# Patient Record
Sex: Male | Born: 1995 | Hispanic: Yes | Marital: Single | Smoking: Never smoker
Health system: Southern US, Community
[De-identification: ages and names within clinical notes are randomized; demographics above are authoritative.]

---
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
## 2017-04-30 ENCOUNTER — Other Ambulatory Visit: Payer: Self-pay | Admitting: Physician Assistant

## 2017-04-30 ENCOUNTER — Ambulatory Visit
Admission: RE | Admit: 2017-04-30 | Discharge: 2017-04-30 | Disposition: A | Discharge status: Home / Self Care | Payer: 59 | Source: Ambulatory Visit | Attending: Physician Assistant | Admitting: Physician Assistant

## 2017-04-30 DIAGNOSIS — M25531 Pain in right wrist: Secondary | ICD-10-CM | POA: Insufficient documentation

## 2017-04-30 DIAGNOSIS — G5601 Carpal tunnel syndrome, right upper limb: Secondary | ICD-10-CM

## 2018-04-06 ENCOUNTER — Ambulatory Visit (INDEPENDENT_AMBULATORY_CARE_PROVIDER_SITE_OTHER): Payer: Worker's Compensation

## 2018-04-06 ENCOUNTER — Encounter: Payer: Self-pay | Admitting: Emergency Medicine

## 2018-04-06 ENCOUNTER — Other Ambulatory Visit: Payer: Self-pay

## 2018-04-06 ENCOUNTER — Ambulatory Visit
Admission: EM | Admit: 2018-04-06 | Discharge: 2018-04-06 | Disposition: A | Discharge status: Home / Self Care | Payer: Worker's Compensation | Attending: Family Medicine | Admitting: Family Medicine

## 2018-04-06 DIAGNOSIS — W319XXA Contact with unspecified machinery, initial encounter: Secondary | ICD-10-CM

## 2018-04-06 DIAGNOSIS — S61210A Laceration without foreign body of right index finger without damage to nail, initial encounter: Secondary | ICD-10-CM | POA: Diagnosis not present

## 2018-04-06 DIAGNOSIS — W208XXD Other cause of strike by thrown, projected or falling object, subsequent encounter: Secondary | ICD-10-CM

## 2018-04-06 DIAGNOSIS — Z23 Encounter for immunization: Secondary | ICD-10-CM | POA: Diagnosis not present

## 2018-04-06 DIAGNOSIS — S61220A Laceration with foreign body of right index finger without damage to nail, initial encounter: Secondary | ICD-10-CM

## 2018-04-06 DIAGNOSIS — W268XXD Contact with other sharp object(s), not elsewhere classified, subsequent encounter: Secondary | ICD-10-CM | POA: Diagnosis not present

## 2018-04-06 MED ORDER — MUPIROCIN 2 % EX OINT: TOPICAL_OINTMENT | CUTANEOUS | 0 refills | Status: AC

## 2018-04-06 MED ORDER — CEPHALEXIN 500 MG PO CAPS: 500.0000 mg | ORAL_CAPSULE | Freq: Four times a day (QID) | ORAL | 0 refills | Status: AC

## 2018-04-06 MED ORDER — BUPIVACAINE HCL (PF) 0.25 % IJ SOLN: 5.0000 mL | Freq: Once | INTRAMUSCULAR | Status: DC

## 2018-04-06 MED ORDER — TETANUS-DIPHTH-ACELL PERTUSSIS 5-2.5-18.5 LF-MCG/0.5 IM SUSP
0.5000 mL | Freq: Once | INTRAMUSCULAR | Status: AC
  Administered 2018-04-06: 0.5 mL via INTRAMUSCULAR

## 2018-04-06 MED ORDER — LIDOCAINE HCL (PF) 1 % IJ SOLN: 5.0000 mL | Freq: Once | INTRAMUSCULAR | Status: DC

## 2018-04-06 NOTE — Discharge Instructions (Signed)
Take medication as prescribed. Keep clean and dry. Wound check with occupational health in 3 days. Suture removal in 10 days. See above to call to schedule.   Follow up with your primary care physician this week as needed. Return to Urgent care for new or worsening concerns.

## 2018-04-06 NOTE — ED Triage Notes (Signed)
Patient c/o laceration to his right index finger today while at work. States he cut it on a machine at work.

## 2018-04-06 NOTE — ED Provider Notes (Signed)
[facility] ____________________________________________  Time seen: Approximately 4:32 PM  I have reviewed the triage vital signs and the nursing notes.   HISTORY  Chief Complaint Extremity Laceration   HPI Gregory Vincent is a 22 y.o. male presenting for evaluation of right index finger laceration that occurred just prior to arrival while at work.  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  States while at work he had turned around and heard a piece of machinery started to fall, and had a reflex he went to catch it, but states the machine went against his finger cutting it.  Denies crush injury.  Applied pressure but no other alleviating measures attempted.  States the machine overall was fairly clean.  Unsure of last tetanus immunization.  States moderate pain to finger currently.  Denies any loss of sensation or pain radiation.  Denies any other pain or injuries.  No fall, head injury or loss of consciousness.  Reports left hand dominant.  Denies other aggravating alleviating factors.    History reviewed. No pertinent past medical history. Denies There are no active problems to display for this patient.   History reviewed. No pertinent surgical history.    Current Facility-Administered Medications:  .  bupivacaine (PF) (MARCAINE) 0.25 % injection 5 mL, 5 mL, Other, Once, Renford Dills, NP .  lidocaine (PF) (XYLOCAINE) 1 % injection 5 mL, 5 mL, Other, Once, Renford Dills, NP  Current Outpatient Medications:  .  cephALEXin (KEFLEX) 500 MG capsule, Take 1 capsule (500 mg total) by mouth 4 (four) times daily for 7 days., Disp: 28 capsule, Rfl: 0 .  mupirocin ointment (BACTROBAN) 2 %, Apply two times a day for 10 days., Disp: 22 g, Rfl: 0  Allergies Patient has no known allergies.  History reviewed. No pertinent family history.  Social History Social History   Tobacco Use  . Smoking status: Never Smoker  . Smokeless tobacco: Never Used  Substance  Use Topics  . Alcohol use: Yes    Comment: socially   . Drug use: Never    Review of Systems Constitutional: No fever Cardiovascular: Denies chest pain. Respiratory: Denies shortness of breath. Gastrointestinal: No abdominal pain.   Musculoskeletal: Negative for back pain. Skin: As above ____________________________________________   PHYSICAL EXAM:  VITAL SIGNS: ED Triage Vitals  Enc Vitals Group     BP 04/06/18 1524 (!) 183/111     Pulse Rate 04/06/18 1524 72     Resp 04/06/18 1524 18     Temp 04/06/18 1524 98.1 F (36.7 C)     Temp Source 04/06/18 1524 Oral     SpO2 04/06/18 1524 97 %     Weight 04/06/18 1522 220 lb (99.8 kg)     Height 04/06/18 1522 5\' 1"  (1.549 m)     Head Circumference --      Peak Flow --      Pain Score 04/06/18 1522 5     Pain Loc --      Pain Edu? --      Excl. in GC? --    Vitals:   04/06/18 1522 04/06/18 1524  BP:  (!) 183/111  Pulse:  72  Resp:  18  Temp:  98.1 F (36.7 C)  TempSrc:  Oral  SpO2:  97%  Weight: 220 lb (99.8 kg)   Height: 5\' 1"  (1.549 m)      Constitutional: Alert and oriented. Well appearing and in no acute distress. ENT      Head: Normocephalic and  atraumatic. Cardiovascular: Normal rate, regular rhythm. Grossly normal heart sounds.  Good peripheral circulation. Respiratory: Normal respiratory effort without tachypnea nor retractions. Breath sounds are clear and equal bilaterally. No wheezes, rales, rhonchi. Musculoskeletal: Steady gait.  Bilateral distal radial pulses equal and easily palpated.  See skin below. Neurologic:  Normal speech and language. No gross focal neurologic deficits are appreciated. Speech is normal. No gait instability.  Skin:  Skin is warm, dry.  Except: Right hand second digit distal phalanx 4cm laceration present circling around from palmar aspect to lateral nail but no nail damage noted, mild tenderness to direct palpation, mild active bleeding, no visualized foreign body, good strength to  distal resisted flexion and extension, no motor or tendon deficit noted, normal distal sensation and capillary refill.     Marland Kitchen.    Psychiatric: Mood and affect are normal. Speech and behavior are normal. Patient exhibits appropriate insight and judgment   ___________________________________________   LABS (all labs ordered are listed, but only abnormal results are displayed)  Labs Reviewed - No data to display  RADIOLOGY  Dg Finger Index Right  Result Date: 04/06/2018 CLINICAL DATA:  Patient with injury and laceration to the distal phalanx. Possible foreign body. EXAM: RIGHT INDEX FINGER 2+V COMPARISON:  Earlier same day FINDINGS: Persistent soft tissue swelling overlying the distal aspect of the index finger secondary to laceration. Persistent punctate ossific density within the soft tissues about the distal aspect of the index finger. No evidence for acute underlying osseous abnormality. IMPRESSION: Persistent punctate radiodensity within soft tissues overlying the distal index finger. No underlying acute osseous abnormality. Electronically Signed   By: Annia Beltrew  Davis M.D.   On: 04/06/2018 17:52   Dg Finger Index Right  Result Date: 04/06/2018 CLINICAL DATA:  Patient with laceration to the right index finger. Initial encounter. EXAM: RIGHT INDEX FINGER 2+V COMPARISON:  None. FINDINGS: Normal anatomic alignment. No evidence for acute fracture or dislocation. Small punctate density within the soft tissues overlying the distal aspect of the index finger, foreign body not excluded. Soft tissue laceration along the distal aspect of the index finger. IMPRESSION: Soft tissue laceration distal aspect of the index finger with punctate radiopaque density, foreign body not excluded. No acute osseous abnormality. Electronically Signed   By: Annia Beltrew  Davis M.D.   On: 04/06/2018 16:30   ____________________________________________   PROCEDURES Procedures   Procedure(s) performed:  Procedure explained  and verbal consent obtained. Consent: Verbal consent obtained. Written consent not obtained. Risks and benefits: risks, benefits and alternatives were discussed including infection and potential retained foreign body. Patient identity confirmed: verbally with patient and hospital-assigned identification number  Consent given by: patient   Laceration Repair Location: Right index finger Length:4 cm Foreign bodies:searched but no foreign bodies visualized or found Tendon involvement: none Nerve involvement: none Preparation: Patient was prepped and draped in the usual sterile fashion. Anesthesia with 3 mL 0.25% bupivacaine digital block and 1% lidocaine for a mild local Irrigation solution: saline and Betadine Irrigation method: jet lavage Amount of cleaning: copious Repaired with 5-0 nylon  Number of sutures: 10 Technique: simple interrupted  Approximation: loose Patient tolerate well. Wound well approximated post repair.  Antibiotic ointment and dressing applied.  Wound care instructions provided.  Observe for any signs of infection or other problems.     INITIAL IMPRESSION / ASSESSMENT AND PLAN / ED COURSE  Pertinent labs & imaging results that were available during my care of the patient were reviewed by me and considered in my medical decision  making (see chart for details).  Well-appearing patient.  No acute distress.  Right second digit laceration that occurred prior to arrival while at work.  Worker's Compensation injury.  X-ray as above. X-ray per radiologist concern for punctate radiopaque density, possible foreign body, no acute osseous abnormality noted.  During repair copiously irrigated, no clear foreign body located.  Repeat image.  Unfortunately still shows punctate foreign body, unable to remove.  Patient verbalized understanding and expressed he was okay with this.  Wound copiously cleaned and irrigated and repaired as above.  Tetanus immunization updated.  Will start  oral Keflex and topical Bactroban.  Discussed keeping clean and wound care.  Occupational health follow-up in 3 to 4 days for wound check and follow-up.  Suture removal in 10 days.  Discussed sooner return follow-up in parameters.  Discussed follow up and return parameters including no resolution or any worsening concerns. Patient verbalized understanding and agreed to plan.   ____________________________________________   FINAL CLINICAL IMPRESSION(S) / ED DIAGNOSES  Final diagnoses:  Laceration of right index finger with foreign body without damage to nail, initial encounter     ED Discharge Orders         Ordered    cephALEXin (KEFLEX) 500 MG capsule  4 times daily     04/06/18 1735    mupirocin ointment (BACTROBAN) 2 %     04/06/18 1735           Note: This dictation was prepared with Dragon dictation along with smaller phrase technology. Any transcriptional errors that result from this process are unintentional.         Renford Dills, NP 04/06/18 1803

## 2019-09-25 IMAGING — CR DG FINGER INDEX 2+V*R*
3 series · 3 of 3 positions shown · non-contrast
Comparison: None.

CLINICAL DATA: Patient with laceration to the right index finger.
Initial encounter.

EXAM:
RIGHT INDEX FINGER 2+V

[finger ap]
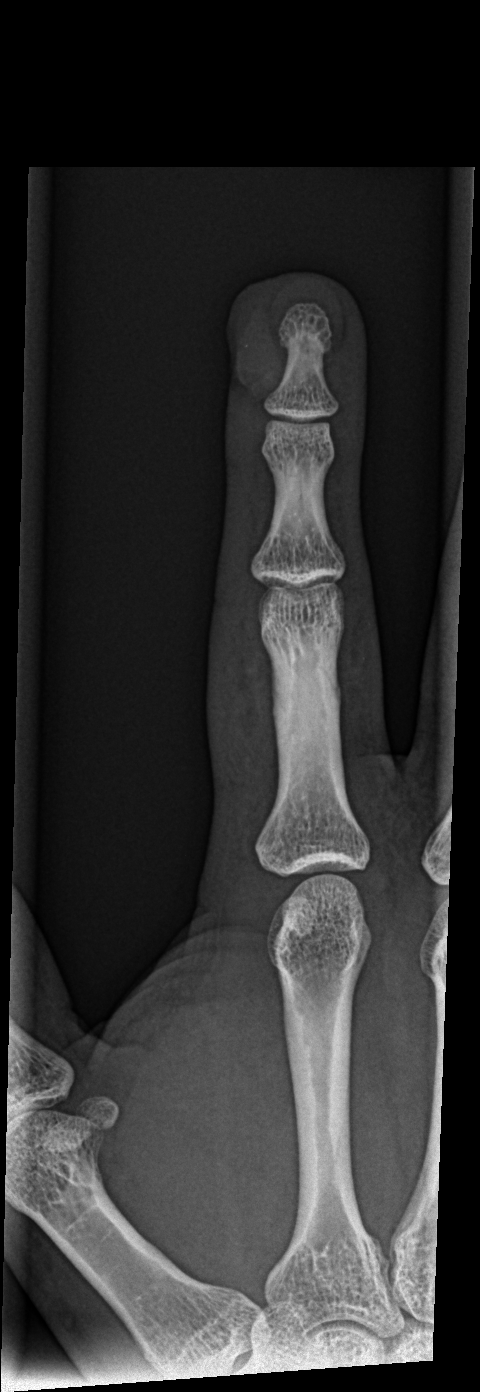

[finger obl]
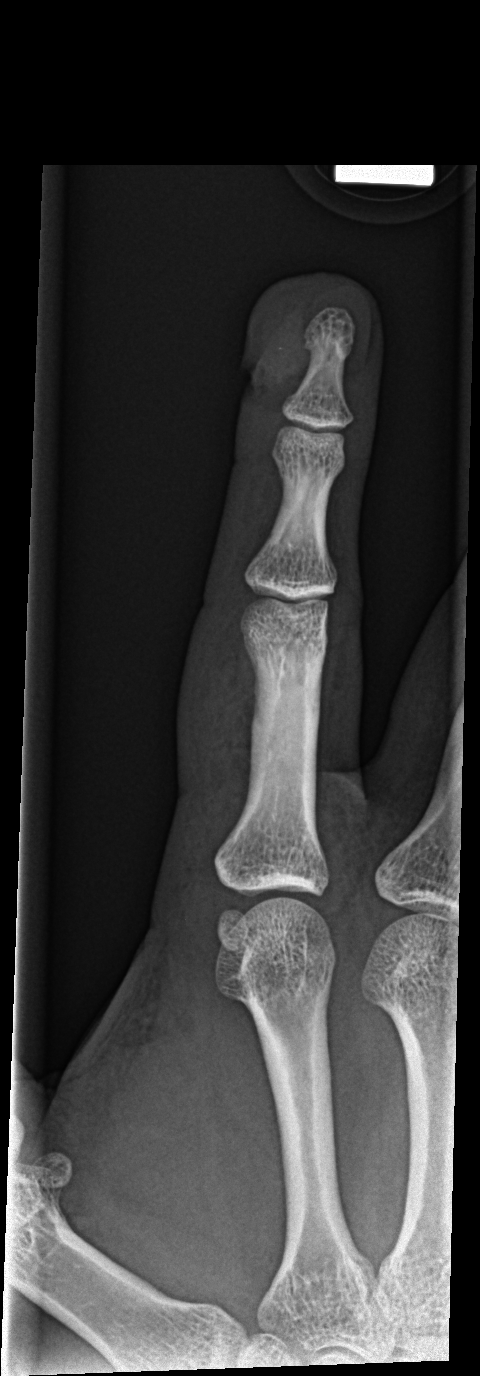

[finger lat]
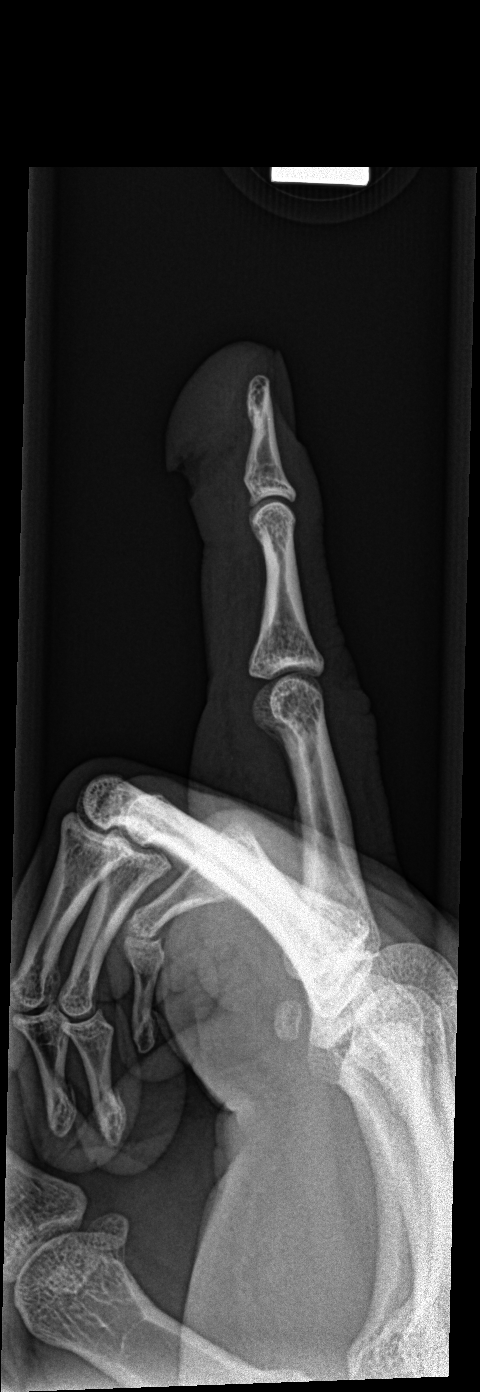

[3 of 3 positions shown; findings below may reference images not displayed]

FINDINGS: Normal anatomic alignment. No evidence for acute fracture or
dislocation. Small punctate density within the soft tissues
overlying the distal aspect of the index finger, foreign body not
excluded. Soft tissue laceration along the distal aspect of the
index finger.
IMPRESSION: Soft tissue laceration distal aspect of the index finger with
punctate radiopaque density, foreign body not excluded.

No acute osseous abnormality.

## 2019-09-25 IMAGING — CR DG FINGER INDEX 2+V*R*
2 series · 2 of 2 positions shown · non-contrast
Comparison: Earlier same day

CLINICAL DATA: Patient with injury and laceration to the distal
phalanx. Possible foreign body.

EXAM:
RIGHT INDEX FINGER 2+V

[finger ap]
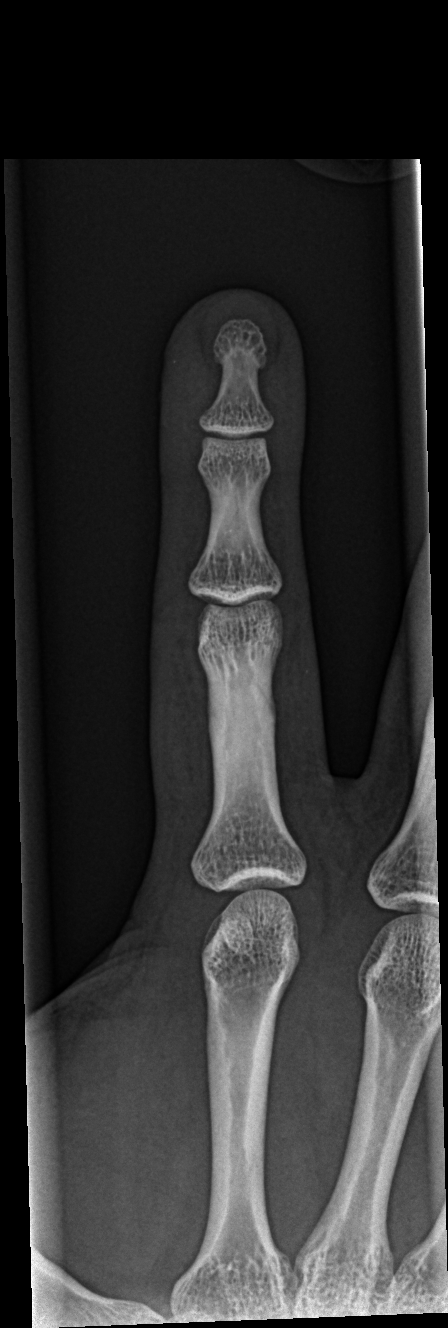

[finger lat]
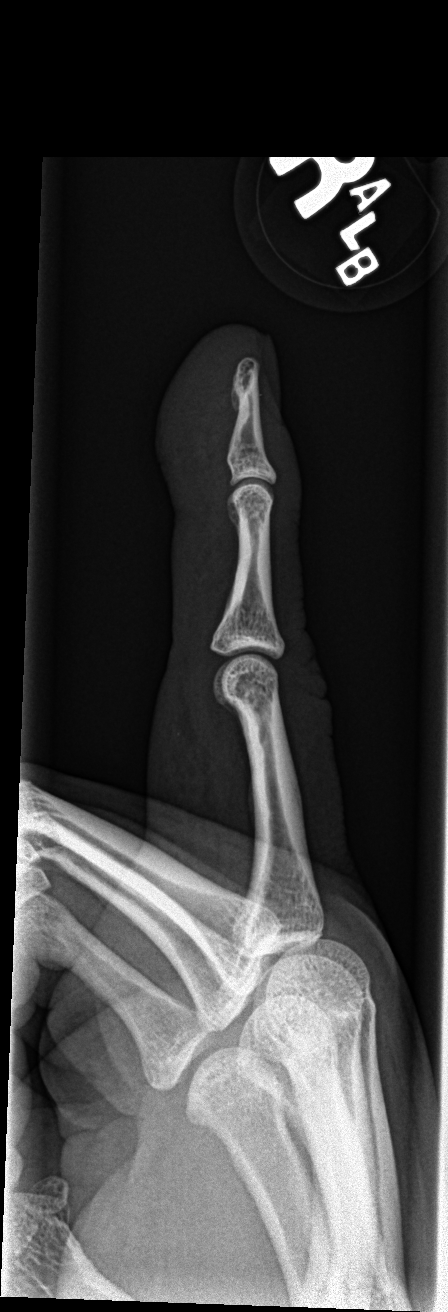

[2 of 2 positions shown; findings below may reference images not displayed]

FINDINGS: Persistent soft tissue swelling overlying the distal aspect of the
index finger secondary to laceration. Persistent punctate ossific
density within the soft tissues about the distal aspect of the index
finger. No evidence for acute underlying osseous abnormality.
IMPRESSION: Persistent punctate radiodensity within soft tissues overlying the
distal index finger. No underlying acute osseous abnormality.
